# Patient Record
Sex: Male | Born: 1960 | Race: Black or African American | Hispanic: No | Smoking: Never smoker
Health system: Southern US, Community
[De-identification: ages and names within clinical notes are randomized; demographics above are authoritative.]

## PROBLEM LIST (undated history)

## (undated) DIAGNOSIS — I10 Essential (primary) hypertension: Secondary | ICD-10-CM

---

## 2005-06-22 ENCOUNTER — Ambulatory Visit: Payer: Self-pay | Admitting: Unknown Physician Specialty

## 2006-05-22 ENCOUNTER — Emergency Department: Payer: Self-pay | Admitting: Emergency Medicine

## 2010-10-30 ENCOUNTER — Emergency Department: Payer: Self-pay | Admitting: Internal Medicine

## 2012-07-21 ENCOUNTER — Ambulatory Visit: Payer: Self-pay | Admitting: Family Medicine

## 2013-03-02 ENCOUNTER — Encounter (HOSPITAL_BASED_OUTPATIENT_CLINIC_OR_DEPARTMENT_OTHER): Payer: Self-pay | Admitting: Emergency Medicine

## 2013-03-02 ENCOUNTER — Emergency Department (HOSPITAL_BASED_OUTPATIENT_CLINIC_OR_DEPARTMENT_OTHER)
Admission: EM | Admit: 2013-03-02 | Discharge: 2013-03-02 | Disposition: A | Discharge status: Home / Self Care | Payer: BC Managed Care – PPO | Attending: Emergency Medicine | Admitting: Emergency Medicine

## 2013-03-02 ENCOUNTER — Emergency Department (HOSPITAL_BASED_OUTPATIENT_CLINIC_OR_DEPARTMENT_OTHER): Payer: BC Managed Care – PPO

## 2013-03-02 DIAGNOSIS — R69 Illness, unspecified: Secondary | ICD-10-CM

## 2013-03-02 DIAGNOSIS — M2550 Pain in unspecified joint: Secondary | ICD-10-CM | POA: Insufficient documentation

## 2013-03-02 DIAGNOSIS — R0789 Other chest pain: Secondary | ICD-10-CM | POA: Insufficient documentation

## 2013-03-02 DIAGNOSIS — J111 Influenza due to unidentified influenza virus with other respiratory manifestations: Secondary | ICD-10-CM | POA: Insufficient documentation

## 2013-03-02 DIAGNOSIS — IMO0001 Reserved for inherently not codable concepts without codable children: Secondary | ICD-10-CM | POA: Insufficient documentation

## 2013-03-02 MED ORDER — KETOROLAC TROMETHAMINE 60 MG/2ML IM SOLN
60.0000 mg | Freq: Once | INTRAMUSCULAR | Status: AC
  Administered 2013-03-02: 60 mg via INTRAMUSCULAR
  Filled 2013-03-02: qty 2

## 2013-03-02 NOTE — Discharge Instructions (Signed)

## 2013-03-02 NOTE — ED Notes (Signed)
3 day history of cough congestion headache states has been unable to work last 3 days yesterday started coughing up yellow thick sputum

## 2013-03-02 NOTE — ED Provider Notes (Signed)
CSN: [id number]     Arrival date & time 03/02/13  [id number] History   First MD Initiated Contact with Patient 03/02/13 0710     Chief Complaint  Patient presents with  . productive cough and head ache    (Consider location/radiation/quality/duration/timing/severity/associated sxs/prior Treatment) HPI Comments: 3 day history of body aches, headache, cough productive of yellow sputum, congestion and rhinorrhea. Some anterior chest pain with coughing. No shortness of breath, abdominal pain nausea or vomiting. Did receive a flu shot. Subjective fevers at home but did not check temperature. Has missed work the past 3 days. Denies any heart or lung troubles. No previous medical history. Does not smoke. Headache is gradual in onset. No photophobia or phonophobia. No visual change.  The history is provided by the patient.    History reviewed. No pertinent past medical history. History reviewed. No pertinent past surgical history. History reviewed. No pertinent family history. History  Substance Use Topics  . Smoking status: Never Smoker   . Smokeless tobacco: Not on file  . Alcohol Use: Yes     Comment: occ    Review of Systems  Constitutional: Positive for activity change, appetite change and fatigue. Negative for fever.  HENT: Positive for congestion and rhinorrhea. Negative for trouble swallowing.   Eyes: Negative for photophobia and visual disturbance.  Respiratory: Positive for cough and chest tightness. Negative for shortness of breath.   Cardiovascular: Negative for chest pain.  Gastrointestinal: Negative for nausea, vomiting and abdominal pain.  Genitourinary: Negative for dysuria and hematuria.  Musculoskeletal: Positive for arthralgias and myalgias.  Skin: Negative for rash.  Neurological: Positive for weakness and headaches.  A complete 10 system review of systems was obtained and all systems are negative except as noted in the HPI and PMH.    Allergies  Review of patient's  allergies indicates no known allergies.  Home Medications  No current outpatient prescriptions on file. BP 142/92  Pulse 74  Temp(Src) 98.2 F (36.8 C) (Oral)  Resp 18  SpO2 98% Physical Exam  Constitutional: He is oriented to person, place, and time. He appears well-developed and well-nourished. No distress.  HENT:  Head: Normocephalic and atraumatic.  Right Ear: External ear normal.  Left Ear: External ear normal.  Mouth/Throat: Oropharynx is clear and moist. No oropharyngeal exudate.  Eyes: Conjunctivae and EOM are normal. Pupils are equal, round, and reactive to light.  Neck: Normal range of motion. Neck supple.  No meningismus  Cardiovascular: Normal rate, regular rhythm and normal heart sounds.   No murmur heard. Pulmonary/Chest: Effort normal and breath sounds normal. No respiratory distress. He has no wheezes.  Abdominal: Soft. There is no tenderness. There is no rebound and no guarding.  Musculoskeletal: Normal range of motion. He exhibits no edema and no tenderness.  Neurological: He is alert and oriented to person, place, and time. No cranial nerve deficit. He exhibits normal muscle tone. Coordination normal.  CN 2-12 intact, no ataxia on finger to nose, no nystagmus, 5/5 strength throughout, no pronator drift, Romberg negative, normal gait.   Skin: Skin is warm.    ED Course  Procedures (including critical care time) Labs Review Labs Reviewed - No data to display Imaging Review Dg Chest 2 View  03/02/2013   CLINICAL DATA:  Three days of cough and congestion  EXAM: CHEST  2 VIEW  COMPARISON:  None.  FINDINGS: The lungs are adequately inflated. There is linear density in the retrosternal region which may reflect atelectasis or scarring. There is no  alveolar infiltrate. There is no pleural effusion. The cardiac silhouette and pulmonary vascularity within the limits of normal. There is no pleural effusion or pneumothorax. The observed portions of the bony thorax appear  normal.  IMPRESSION: There may be subsegmental atelectasis in the retrosternal region. There is no evidence of pneumonia nor CHF.   Electronically Signed   By: David  [place]   On: 03/02/2013 07:34    EKG Interpretation    Date/Time:  Thursday March 02 2013 07:19:55 EST Ventricular Rate:  73 PR Interval:  154 QRS Duration: 88 QT Interval:  372 QTC Calculation: 409 R Axis:   -6 Text Interpretation:  Normal sinus rhythm Normal ECG No previous ECGs available Confirmed by Mallory Schaad  MD, Anjana Cheek ([id number]) on 03/02/2013 7:21:30 AM            MDM   1. Influenza-like illness    3 days of bodyaches, cough, congestion, headache.  Vitals stable. No distress. Nontoxic appearing.  Suspect viral syndrome, ILI.   X-ray negative. Discussed supportive care for influenza-like illness and course illness lasting for 5-7 days the patient.  Glynn Octave, MD 03/02/13 [phone]

## 2013-03-02 NOTE — ED Notes (Signed)
MD at bedside. 

## 2015-02-03 DEATH — deceased

## 2016-02-21 ENCOUNTER — Emergency Department (HOSPITAL_BASED_OUTPATIENT_CLINIC_OR_DEPARTMENT_OTHER)
Admission: EM | Admit: 2016-02-21 | Discharge: 2016-02-21 | Disposition: A | Discharge status: Home / Self Care | Payer: BC Managed Care – PPO | Attending: Emergency Medicine | Admitting: Emergency Medicine

## 2016-02-21 ENCOUNTER — Emergency Department (HOSPITAL_BASED_OUTPATIENT_CLINIC_OR_DEPARTMENT_OTHER): Payer: BC Managed Care – PPO

## 2016-02-21 ENCOUNTER — Encounter (HOSPITAL_BASED_OUTPATIENT_CLINIC_OR_DEPARTMENT_OTHER): Payer: Self-pay

## 2016-02-21 DIAGNOSIS — R51 Headache: Secondary | ICD-10-CM | POA: Insufficient documentation

## 2016-02-21 DIAGNOSIS — R110 Nausea: Secondary | ICD-10-CM | POA: Insufficient documentation

## 2016-02-21 DIAGNOSIS — I10 Essential (primary) hypertension: Secondary | ICD-10-CM | POA: Diagnosis not present

## 2016-02-21 DIAGNOSIS — R519 Headache, unspecified: Secondary | ICD-10-CM

## 2016-02-21 HISTORY — DX: Essential (primary) hypertension: I10

## 2016-02-21 MED ORDER — SODIUM CHLORIDE 0.9 % IV BOLUS (SEPSIS)
1000.0000 mL | Freq: Once | INTRAVENOUS | Status: AC
  Administered 2016-02-21: 1000 mL via INTRAVENOUS

## 2016-02-21 MED ORDER — DEXAMETHASONE SODIUM PHOSPHATE 10 MG/ML IJ SOLN
10.0000 mg | Freq: Once | INTRAMUSCULAR | Status: AC
  Administered 2016-02-21: 10 mg via INTRAVENOUS
  Filled 2016-02-21: qty 1

## 2016-02-21 MED ORDER — METOCLOPRAMIDE HCL 5 MG/ML IJ SOLN
10.0000 mg | Freq: Once | INTRAMUSCULAR | Status: AC
  Administered 2016-02-21: 10 mg via INTRAVENOUS
  Filled 2016-02-21: qty 2

## 2016-02-21 MED ORDER — KETOROLAC TROMETHAMINE 30 MG/ML IJ SOLN
30.0000 mg | Freq: Once | INTRAMUSCULAR | Status: AC
  Administered 2016-02-21: 30 mg via INTRAVENOUS
  Filled 2016-02-21: qty 1

## 2016-02-21 NOTE — ED Notes (Signed)
Pt resting quietly, states feels much better.

## 2016-02-21 NOTE — ED Notes (Signed)
Lights dimmed in room, comfort measures provided

## 2016-02-21 NOTE — ED Notes (Signed)
Signature pad not working, reviewed DC instructions with pt, opportunity for questions provided. Teach Back method used

## 2016-02-21 NOTE — ED Triage Notes (Signed)
C/o HA x 1 week-denies head injury prior to pain-was seen by PCP Tuesday-checked BP -no new meds-pt NAD-presents to triage in w/c

## 2016-02-21 NOTE — ED Notes (Signed)
[facility], states had no relief from HA after taking pain med, relief occurs with rest and sleep

## 2016-02-21 NOTE — ED Notes (Signed)
Patient transported to CT 

## 2016-02-21 NOTE — ED Provider Notes (Signed)
[facility] Note   CSN: [id number] Arrival date & time: 02/21/16  1638   By signing my name below, I, Nelwyn Salisbury, attest that this documentation has been prepared under the direction and in the presence of Lavera Guise, MD . Electronically Signed: Nelwyn Salisbury, Scribe. 02/21/2016. 8:47 PM.  History   Chief Complaint Chief Complaint  Patient presents with  . Headache   The history is provided by the patient. No language interpreter was used.    HPI Comments:  Aaron Patton is a 56 y.o. male with pmhx of HTN who presents to the Emergency Department complaining of gradual-onset, intermittent, unchanged headache beginning about a month ago. Pt states his headache is localized around his forehead and he currently rates it as a 5/10. No modifying factors indicated. He notes that he has regular headaches but his current symptoms are persistent and atypical for him. Pt reports associated nausea and chills. He has tried [facility] powder with minimal relief. He denies any photophobia, cough, congestion, rhinorrhea, visual changes, numbness, weakness or speech changes. No fever. Pt was seen by his PCP 3 days ago and was told his BP was high. He was asked to return next week to his PCP to discuss his BP and for recheck.   Past Medical History:  Diagnosis Date  . Hypertension     There are no active problems to display for this patient.   History reviewed. No pertinent surgical history.     Home Medications    Prior to Admission medications   Not on File    Family History No family history on file. Reviewed. Non-contributory Social History Social History  Substance Use Topics  . Smoking status: Never Smoker  . Smokeless tobacco: Never Used  . Alcohol use Yes     Comment: occ     Allergies   Patient has no known allergies.   Review of Systems Review of Systems 10/14 Systems reviewed and are negative for acute change except as noted in the  HPI.   Physical Exam Updated Vital Signs BP 131/88 (BP Location: Right Arm)   Pulse 64   Temp 97.5 F (36.4 C) (Oral)   Resp 14   Ht 5\' 11"  (1.803 m)   Wt 205 lb (93 kg)   SpO2 97%   BMI 28.59 kg/m   Physical Exam Physical Exam  Nursing note and vitals reviewed. Constitutional: Well developed, well nourished, non-toxic, and in no acute distress Head: Normocephalic and atraumatic.  Mouth/Throat: Oropharynx is clear and moist.  Neck: Normal range of motion. Neck supple. No meningismus. Cardiovascular: Normal rate and regular rhythm.   Pulmonary/Chest: Effort normal and breath sounds normal.  Abdominal: Soft. There is no tenderness. There is no rebound and no guarding.  Musculoskeletal: Normal range of motion.  Neurological:  Alert, oriented to person, place, time, and situation. Memory grossly in tact. Fluent speech. No dysarthria or aphasia.  Cranial nerves: Pupils are symmetric, and reactive to light. EOMI without nystagmus. No gaze deviation. Facial muscles symmetric with activation. Sensation to light touch over face in tact bilaterally. Hearing grossly in tact. Palate elevates symmetrically. Head turn and shoulder shrug are intact. Tongue midline.  Reflexes defered.  Muscle bulk and tone normal. No pronator drift. Moves all extremities symmetrically. Sensation to light touch is in tact throughout in bilateral upper and lower extremities. Coordination reveals no dysmetria with finger to nose. Gait is narrow-based and steady. Non-ataxic. Skin: Skin is warm and dry.  Psychiatric: Cooperative  ED Treatments / Results  DIAGNOSTIC STUDIES:  Oxygen Saturation is 99% on RA, normal by my interpretation.    COORDINATION OF CARE:  9:04 PM Discussed treatment plan with pt at bedside which includes imaging and migraine cocktail and pt agreed to plan.  Labs (all labs ordered are listed, but only abnormal results are displayed) Labs Reviewed - No data to display  EKG  EKG  Interpretation None       Radiology Ct Head Wo Contrast  Result Date: 02/21/2016 CLINICAL DATA:  Daily intermittent headache for 3 weeks.  Nausea. EXAM: CT HEAD WITHOUT CONTRAST TECHNIQUE: Contiguous axial images were obtained from the base of the skull through the vertex without intravenous contrast. COMPARISON:  None. FINDINGS: Brain: No intracranial hemorrhage, mass effect, or midline shift. No hydrocephalus. The basilar cisterns are patent. No evidence of territorial infarct. No intracranial fluid collection. Vascular: No hyperdense vessel or unexpected calcification. Skull: Normal. Negative for fracture or focal lesion. Sinuses/Orbits: Paranasal sinuses and mastoid air cells are clear. The visualized orbits are unremarkable. Other: None. IMPRESSION: No acute intracranial abnormality.  No explanation for headache. Electronically Signed   By: Rubye OaksMelanie  Ehinger M.D.   On: 02/21/2016 21:57    Procedures Procedures (including critical care time)  Medications Ordered in ED Medications  ketorolac (TORADOL) 30 MG/ML injection 30 mg (30 mg Intravenous Given 02/21/16 2116)  metoCLOPramide (REGLAN) injection 10 mg (10 mg Intravenous Given 02/21/16 2117)  dexamethasone (DECADRON) injection 10 mg (10 mg Intravenous Given 02/21/16 2116)  sodium chloride 0.9 % bolus 1,000 mL (0 mLs Intravenous Stopped 02/21/16 2245)     Initial Impression / Assessment and Plan / ED Course  I have reviewed the triage vital signs and the nursing notes.  Pertinent labs & imaging results that were available during my care of the patient were reviewed by me and considered in my medical decision making (see chart for details).     Presenting with reported one month of daily headaches. Is nontoxic in no acute distress. Vital signs within normal limits. He has a normal neurological exam. CT head performed given daily headaches for one month, this is visualized. Shows no acute intracranial processes. Symptoms and exam not  consistent with that of infection such as meningitis or encephalitis. Not of sudden onset maximal intensity to suggest subarachnoid hemorrhage. Given migraine cocktail, and headache significantly improved. Blood pressure is not significantly elevated ED to explain headaches. We'll continue outpatient management for headaches and PCP follow-up for ongoing management. Strict return and follow-up instructions reviewed. She expressed understanding of all discharge instructions and felt comfortable with the plan of care.   Final Clinical Impressions(s) / ED Diagnoses   Final diagnoses:  Acute nonintractable headache, unspecified headache type    New Prescriptions There are no discharge medications for this patient.  I personally performed the services described in this documentation, which was scribed in my presence. The recorded information has been reviewed and is accurate.     Lavera Guiseana Duo Bayli Quesinberry, MD 02/21/16 2350

## 2016-02-21 NOTE — Discharge Instructions (Signed)
Your CT head does not show any major abnormalities.  Continue to take ibuprofen and tylenol as needed for headache. Get rest and drink fluids.  Please follow-up with your primary care doctor as scheduled for re-evaluation.  Return for worsening symptoms, including confusion, escalating pain, intractable vomiting. new numbness/weakness, new vision or speech canges, or any other symptoms concerning to you.

## 2016-02-21 NOTE — ED Notes (Signed)
Presents with c/o "bad headache" w/ some nausea. Unable to sleep last PM due to HA. Onset approx 1 week ago, worse as week progressed, HA are intermittent

## 2017-09-08 IMAGING — CT CT HEAD W/O CM
3 series · 15 of 47 positions shown, 18 images · non-contrast
Comparison: None.

CLINICAL DATA: Daily intermittent headache for 3 weeks.  Nausea.

EXAM:
CT HEAD WITHOUT CONTRAST
TECHNIQUE: Contiguous axial images were obtained from the base of the skull
through the vertex without intravenous contrast.

[Series 2: head wo · axial · 0.48mm/px · z∈[+800,+925]mm · 9 of 31 slices shown, 12 images]
[im 3/31  brain]
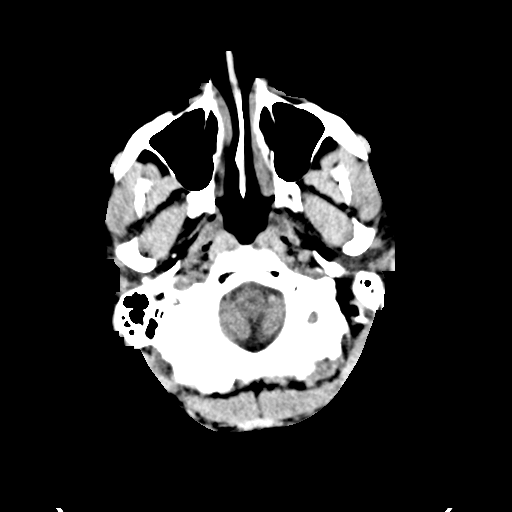
[im 3/31  bone]
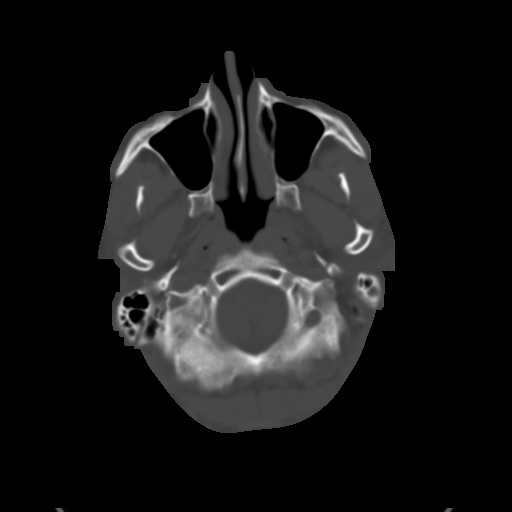
[im 6/31  brain]
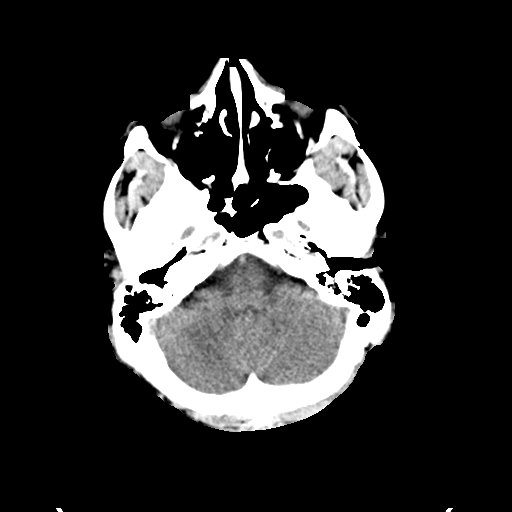
[im 9/31  brain]
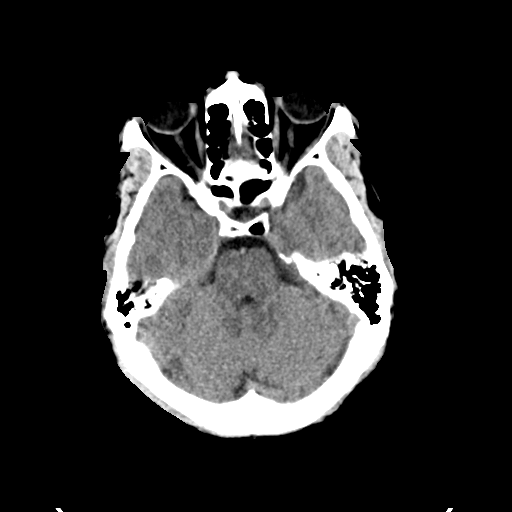
[im 12/31  brain]
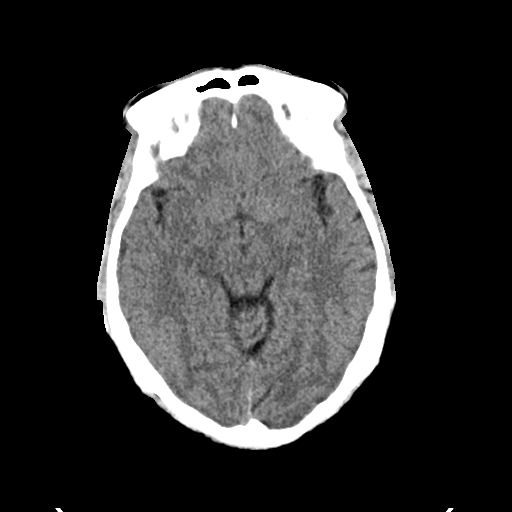
[im 16/31  brain]
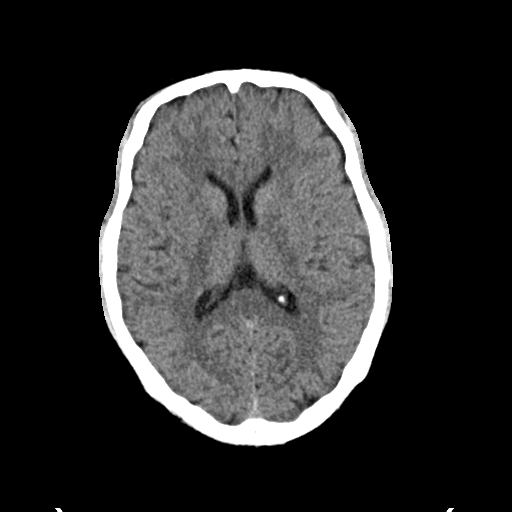
[im 16/31  bone]
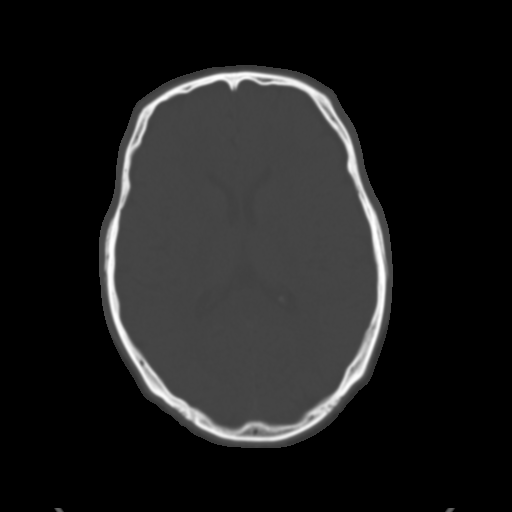
[im 19/31  brain]
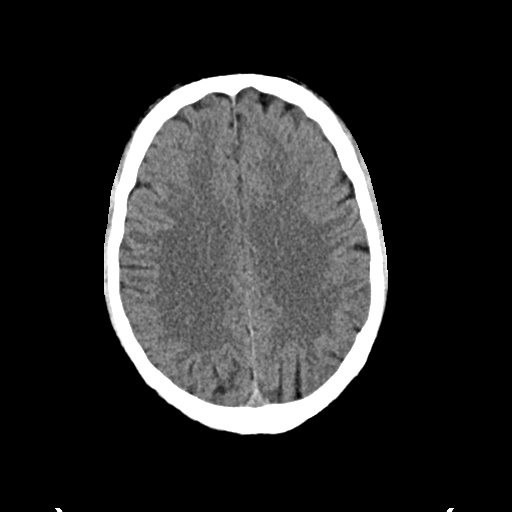
[im 22/31  brain]
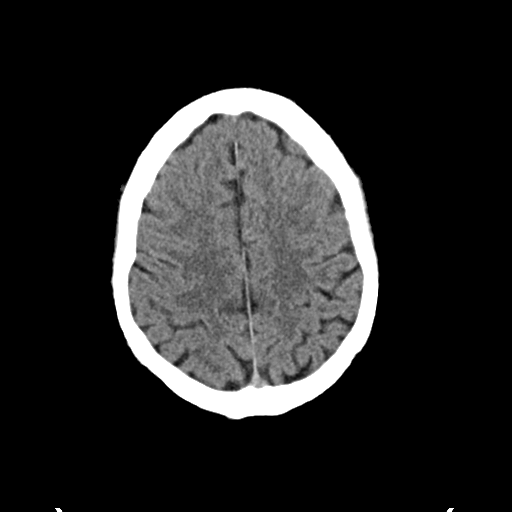
[im 25/31  brain]
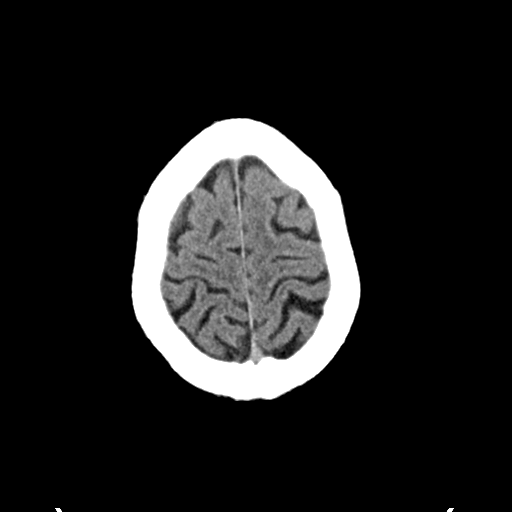
[im 28/31  brain]
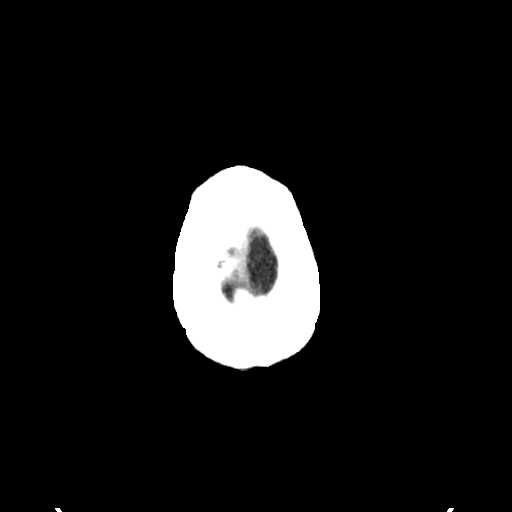
[im 28/31  bone]
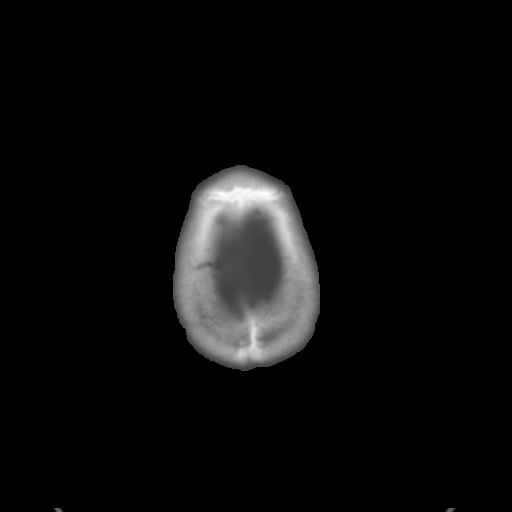

[Series 4: coronal soft · coronal · 0.31mm/px · 3 of 72 slices shown]
[im 24/72  brain]
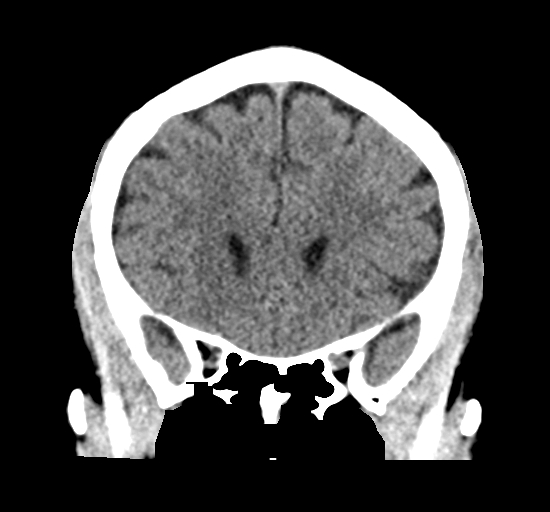
[im 32/72  brain]
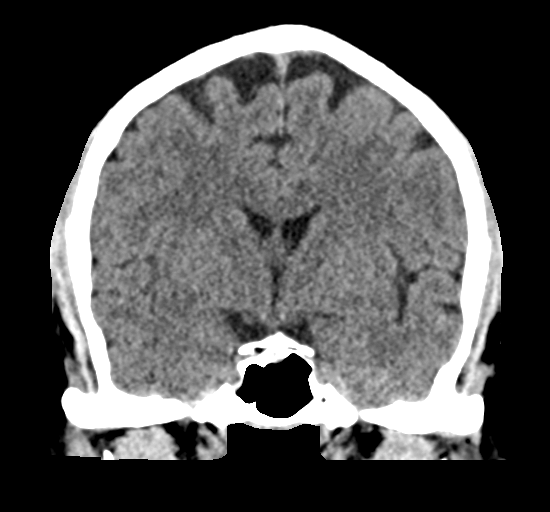
[im 40/72  brain]
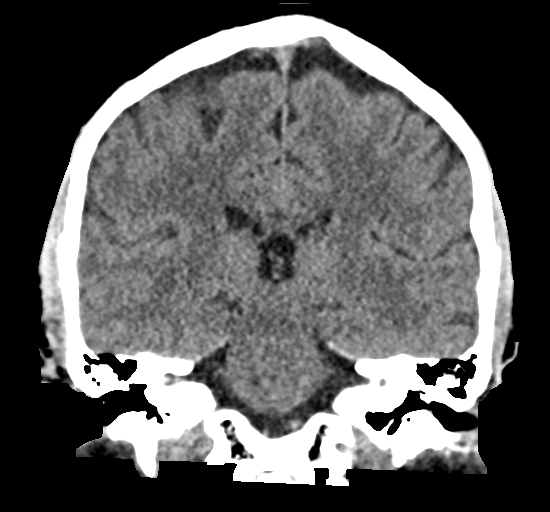

[Series 5: sag soft · sagittal · 0.32mm/px · 3 of 58 slices shown]
[im 20/58  brain]
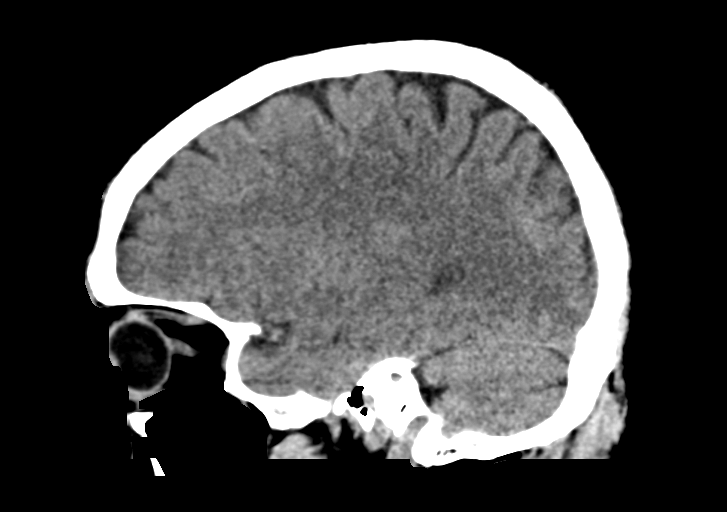
[im 29/58  brain]
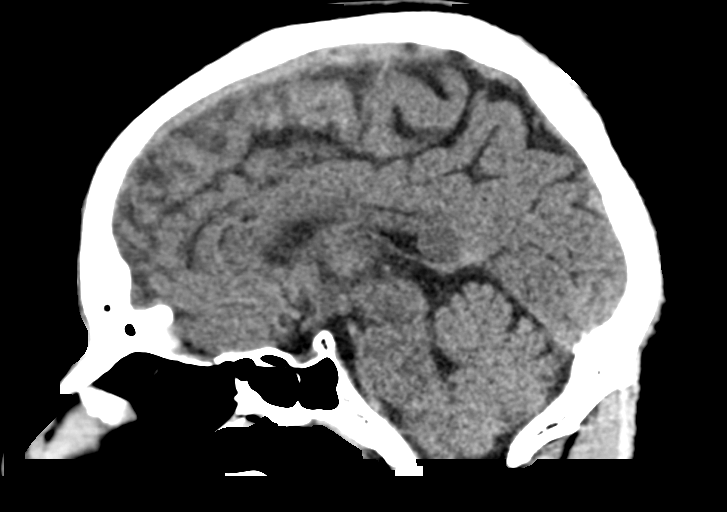
[im 39/58  brain]
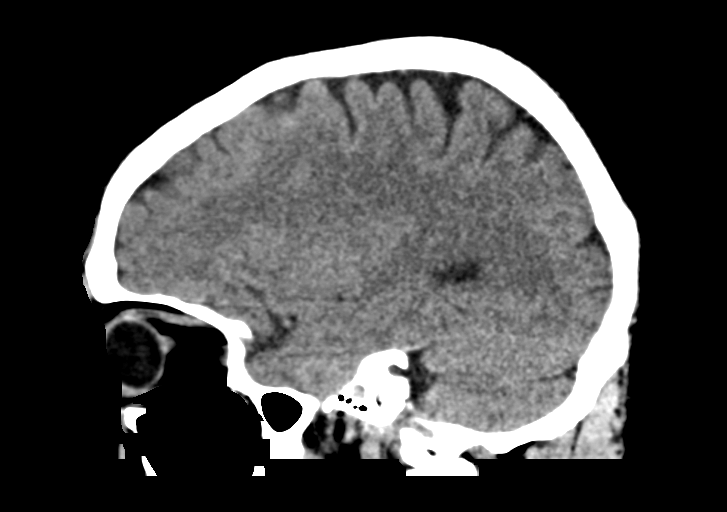

[15 of 47 positions shown; findings below may reference images not displayed]

FINDINGS: Brain: No intracranial hemorrhage, mass effect, or midline shift. No
hydrocephalus. The basilar cisterns are patent. No evidence of
territorial infarct. No intracranial fluid collection.

Vascular: No hyperdense vessel or unexpected calcification.

Skull: Normal. Negative for fracture or focal lesion.

Sinuses/Orbits: Paranasal sinuses and mastoid air cells are clear.
The visualized orbits are unremarkable.

Other: None.
IMPRESSION: No acute intracranial abnormality.  No explanation for headache.

## 2019-03-06 DEATH — deceased
